# Patient Record
Sex: Male | Born: 1990 | Race: White | Hispanic: No | Marital: Single | Smoking: Current every day smoker
Health system: Southern US, Community
[De-identification: ages and names within clinical notes are randomized; demographics above are authoritative.]

## PROBLEM LIST (undated history)

## (undated) DIAGNOSIS — F32A Depression, unspecified: Secondary | ICD-10-CM

## (undated) DIAGNOSIS — K219 Gastro-esophageal reflux disease without esophagitis: Secondary | ICD-10-CM

## (undated) DIAGNOSIS — F429 Obsessive-compulsive disorder, unspecified: Secondary | ICD-10-CM

## (undated) DIAGNOSIS — G4700 Insomnia, unspecified: Secondary | ICD-10-CM

## (undated) DIAGNOSIS — G8929 Other chronic pain: Secondary | ICD-10-CM

## (undated) DIAGNOSIS — F329 Major depressive disorder, single episode, unspecified: Secondary | ICD-10-CM

## (undated) DIAGNOSIS — F419 Anxiety disorder, unspecified: Secondary | ICD-10-CM

## (undated) HISTORY — PX: SHOULDER ARTHROSCOPY W/ LABRAL REPAIR: SHX2399

---
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
## 2019-07-13 ENCOUNTER — Ambulatory Visit
Admission: EM | Admit: 2019-07-13 | Discharge: 2019-07-13 | Disposition: A | Discharge status: Home / Self Care | Payer: 59 | Attending: Family Medicine | Admitting: Family Medicine

## 2019-07-13 ENCOUNTER — Ambulatory Visit (INDEPENDENT_AMBULATORY_CARE_PROVIDER_SITE_OTHER): Payer: 59

## 2019-07-13 DIAGNOSIS — R0789 Other chest pain: Secondary | ICD-10-CM

## 2019-07-13 HISTORY — DX: Depression, unspecified: F32.A

## 2019-07-13 HISTORY — DX: Gastro-esophageal reflux disease without esophagitis: K21.9

## 2019-07-13 HISTORY — DX: Obsessive-compulsive disorder, unspecified: F42.9

## 2019-07-13 HISTORY — DX: Insomnia, unspecified: G47.00

## 2019-07-13 HISTORY — DX: Anxiety disorder, unspecified: F41.9

## 2019-07-13 HISTORY — DX: Other chronic pain: G89.29

## 2019-07-13 LAB — BASIC METABOLIC PANEL
Anion gap: 7 (ref 5–15)
BUN: 14 mg/dL (ref 6–20)
CO2: 26 mmol/L (ref 22–32)
Calcium: 9.2 mg/dL (ref 8.9–10.3)
Chloride: 104 mmol/L (ref 98–111)
Creatinine, Ser: 0.85 mg/dL (ref 0.61–1.24)
GFR calc Af Amer: >60 mL/min (ref >60)
GFR calc non Af Amer: >60 mL/min (ref >60)
Glucose, Bld: 111 mg/dL — ABNORMAL HIGH (ref 70–99)
Potassium: 3.9 mmol/L (ref 3.5–5.1)
Sodium: 137 mmol/L (ref 135–145)

## 2019-07-13 LAB — CBC WITH DIFFERENTIAL/PLATELET
Abs Immature Granulocytes: 0.05 10*3/uL (ref 0.00–0.07)
Basophils Absolute: 0.1 10*3/uL (ref 0.0–0.1)
Basophils Relative: 1 %
Eosinophils Absolute: 0.2 10*3/uL (ref 0.0–0.5)
Eosinophils Relative: 2 %
HCT: 42.4 % (ref 39.0–52.0)
Hemoglobin: 14.8 g/dL (ref 13.0–17.0)
Immature Granulocytes: 0 %
Lymphocytes Relative: 11 %
Lymphs Abs: 1.4 10*3/uL (ref 0.7–4.0)
MCH: 32 pg (ref 26.0–34.0)
MCHC: 34.9 g/dL (ref 30.0–36.0)
MCV: 91.8 fL (ref 80.0–100.0)
Monocytes Absolute: 0.7 10*3/uL (ref 0.1–1.0)
Monocytes Relative: 5 %
Neutro Abs: 10 10*3/uL — ABNORMAL HIGH (ref 1.7–7.7)
Neutrophils Relative %: 81 %
Platelets: 235 10*3/uL (ref 150–400)
RBC: 4.62 MIL/uL (ref 4.22–5.81)
RDW: 12.5 % (ref 11.5–15.5)
WBC: 12.4 10*3/uL — ABNORMAL HIGH (ref 4.0–10.5)
nRBC: 0 % (ref 0.0–0.2)

## 2019-07-13 NOTE — Discharge Instructions (Signed)
Follow up with Primary Care Provider for further evaluation/management and referral to cardiology

## 2019-07-13 NOTE — ED Provider Notes (Signed)
[facility]    CSN: [id number] Arrival date & time: 07/13/19  1056     History   Chief Complaint Chief Complaint  Patient presents with  . Chest Pain    HPI Darrell Meadows is a 28 y.o. male.   28 yo male with a c/o left sided chest pains yesterday. States he took some aspirin and felt better. States he felt the pain after he had been smoking marijuana yesterday and that he's had similar episodes in the past after smoking marijuana. Today was driving back home and felt a little bit dizzy and decided to come in for evaluation. States he took some aspirin about 30 min ago. Currently denies palpitations, shortness of breath, chest pressure, arm pain, jaw pain, neck pain. States he "feels like my heart may be enlarged" and would like to get checked for "LVH".   Also states with prior similar episodes in the past he's been evaluated and has had normal EKGs and Troponin.   Also states he takes medications for "mental health" issues.   The history is provided by the patient.    Past Medical History:  Diagnosis Date  . Anxiety   . Chronic pain   . Depression   . GERD (gastroesophageal reflux disease)   . Insomnia   . OCD (obsessive compulsive disorder)     There are no active problems to display for this patient.   Past Surgical History:  Procedure Laterality Date  . APPENDECTOMY  2003  . SHOULDER ARTHROSCOPY W/ LABRAL REPAIR         Home Medications    Prior to Admission medications   Medication Sig Start Date End Date Taking? Authorizing Provider  clomiPRAMINE (ANAFRANIL) 50 MG capsule Take 50 mg by mouth at bedtime.   Yes [provider]  dexlansoprazole (DEXILANT) 60 MG capsule Take 60 mg by mouth daily.   Yes [provider]  nebivolol (BYSTOLIC) 10 MG tablet Take 10 mg by mouth daily.   Yes [provider]  pregabalin (LYRICA) 150 MG capsule Take 150 mg by mouth 2 (two) times daily.   Yes [provider]   rosuvastatin (CRESTOR) 20 MG tablet Take 20 mg by mouth daily.   Yes [provider]  traZODone (DESYREL) 50 MG tablet Take 50 mg by mouth at bedtime.   Yes [provider]  vortioxetine HBr (TRINTELLIX) 20 MG TABS tablet Take 20 mg by mouth daily.   Yes [provider]    Family History Family History  Problem Relation Age of Onset  . Hypertension Mother   . Hyperlipidemia Mother   . Diabetes Father     Social History Social History   Tobacco Use  . Smoking status: Current Every Day Smoker    Types: Cigarettes  . Smokeless tobacco: Never Used  Substance Use Topics  . Alcohol use: Not Currently    Frequency: Never  . Drug use: Yes    Types: Marijuana     Allergies   Penicillins   Review of Systems Review of Systems   Physical Exam Triage Vital Signs ED Triage Vitals  Enc Vitals Group     BP 07/13/19 1102 127/72     Pulse Rate 07/13/19 1102 83     Resp 07/13/19 1102 18     Temp 07/13/19 1102 98.2 F (36.8 C)     Temp Source 07/13/19 1102 Oral     SpO2 07/13/19 1102 100 %     Weight 07/13/19 1104 220  lb (99.8 kg)     Height --      Head Circumference --      Peak Flow --      Pain Score 07/13/19 1104 4     Pain Loc --      Pain Edu? --      Excl. in GC? --    Orthostatic VS for the past 24 hrs:  BP- Lying Pulse- Lying BP- Sitting Pulse- Sitting BP- Standing at 0 minutes Pulse- Standing at 0 minutes  07/13/19 1228 130/70 75 134/71 80 117/74 90    Updated Vital Signs BP 118/76 (BP Location: Right Arm)   Pulse 83   Temp 98.2 F (36.8 C) (Oral)   Resp 18   Wt 99.8 kg   SpO2 100%   Visual Acuity Right Eye Distance:   Left Eye Distance:   Bilateral Distance:    Right Eye Near:   Left Eye Near:    Bilateral Near:     Physical Exam Vitals signs and nursing note reviewed.  Constitutional:      General: He is not in acute distress.    Appearance: He is not toxic-appearing or diaphoretic.  Cardiovascular:     Rate and  Rhythm: Normal rate and regular rhythm.     Pulses: Normal pulses.     Heart sounds: Normal heart sounds.  Pulmonary:     Effort: Pulmonary effort is normal. No respiratory distress.     Breath sounds: Normal breath sounds. No stridor. No wheezing, rhonchi or rales.  Musculoskeletal:     Right lower leg: No edema.     Left lower leg: No edema.  Neurological:     Mental Status: He is alert.      UC Treatments / Results  Labs (all labs ordered are listed, but only abnormal results are displayed) Labs Reviewed  CBC WITH DIFFERENTIAL/PLATELET - Abnormal; Notable for the following components:      Result Value   WBC 12.4 (*)    Neutro Abs 10.0 (*)    All other components within normal limits  BASIC METABOLIC PANEL - Abnormal; Notable for the following components:   Glucose, Bld 111 (*)    All other components within normal limits    EKG   Radiology Dg Chest 2 View  Result Date: 07/13/2019 CLINICAL DATA:  Left-sided chest pain palpitations.  Dizziness. EXAM: CHEST - 2 VIEW COMPARISON:  None. FINDINGS: The heart size and mediastinal contours are within normal limits. Both lungs are clear. No evidence of pneumothorax or pleural effusion. The visualized skeletal structures are unremarkable. IMPRESSION: Negative.  No active cardiopulmonary disease. Electronically Signed   By: Danae OrleansJohn A Stahl M.D.   On: 07/13/2019 11:58    Procedures ED EKG  Date/Time: 07/13/2019 5:25 PM Performed by: Payton Mccallumonty, Ernestyne Caldwell, MD Authorized by: Payton Mccallumonty, Bao Bazen, MD   ECG reviewed by ED Physician in the absence of a cardiologist: yes   Previous ECG:    Previous ECG:  Unavailable Interpretation:    Interpretation: normal   Rate:    ECG rate:  76   ECG rate assessment: normal   Rhythm:    Rhythm: sinus rhythm   Ectopy:    Ectopy: none   QRS:    QRS axis:  Normal   QRS intervals:  Normal Conduction:    Conduction: normal   ST segments:    ST segments:  Normal T waves:    T waves: normal      (including critical care time)  Medications Ordered in UC Medications - No data to display  Initial Impression / Assessment and Plan / UC Course  I have reviewed the triage vital signs and the nursing notes.  Pertinent labs & imaging results that were available during my care of the patient were reviewed by me and considered in my medical decision making (see chart for details).      Final Clinical Impressions(s) / UC Diagnoses   Final diagnoses:  Atypical chest pain     Discharge Instructions     Follow up with Primary Care Provider for further evaluation/management and referral to cardiology     ED Prescriptions    None      1. Labs/x-ray/ekg results and diagnosis reviewed with patient 2.Recommend supportive treatment as above; instructed to go to Emergency Department if symptoms recur 3. Follow-up prn   Controlled Substance Prescriptions [place] Controlled Substance Registry consulted? Not Applicable   Payton Mccallumonty, Majestic Brister, MD 07/13/19 1731

## 2019-07-13 NOTE — ED Triage Notes (Signed)
Pt was having left sided chest pain and did chew up some aspirin and pain did get much better. Has a hx of chest pain, palpitations with normal EKG and normal troponin. States he did feel like he was going to pass out before he took the aspirin. Feels as if "his heart is enlarged" right now he is having left sided chest discomfort and dizzy. Did take 325mg  aspirin 30 mins ago.

## 2020-07-17 IMAGING — CR CHEST - 2 VIEW
2 series · 2 of 2 positions shown · non-contrast
Comparison: None.

CLINICAL DATA: Left-sided chest pain palpitations.  Dizziness.

EXAM:
CHEST - 2 VIEW

[chest pa]
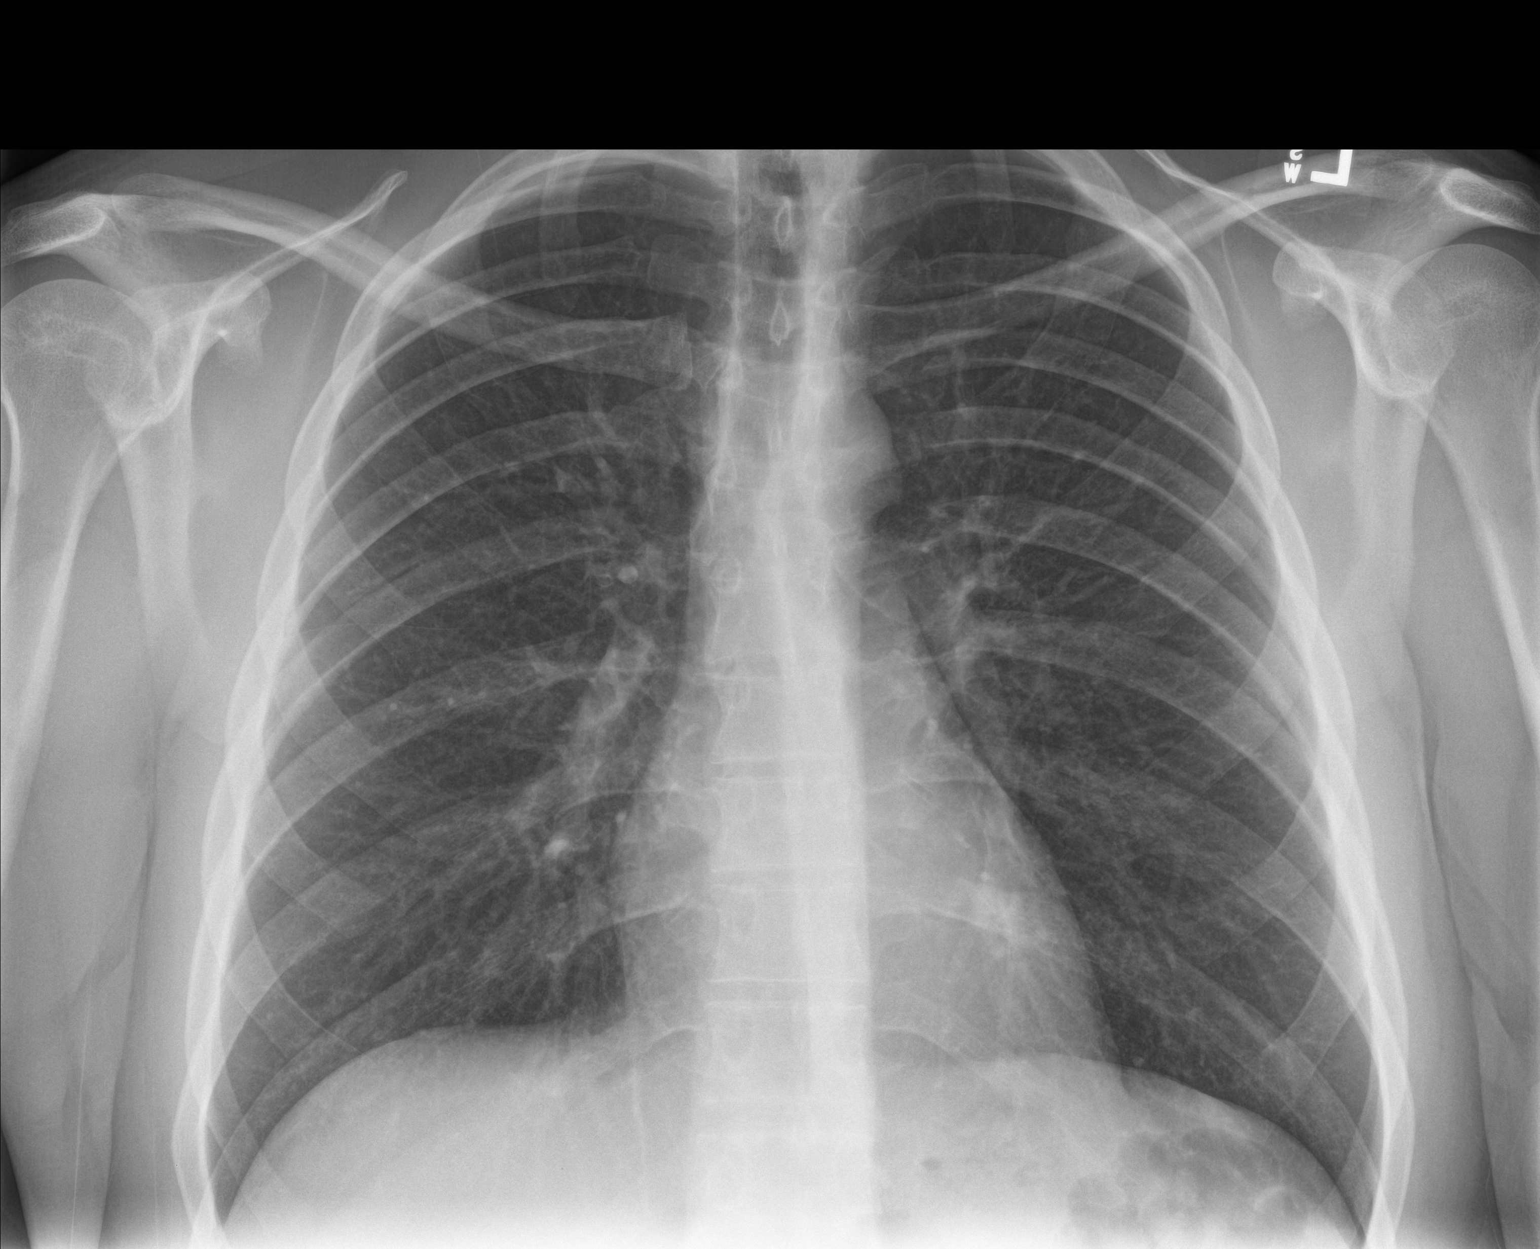

[chest lat]
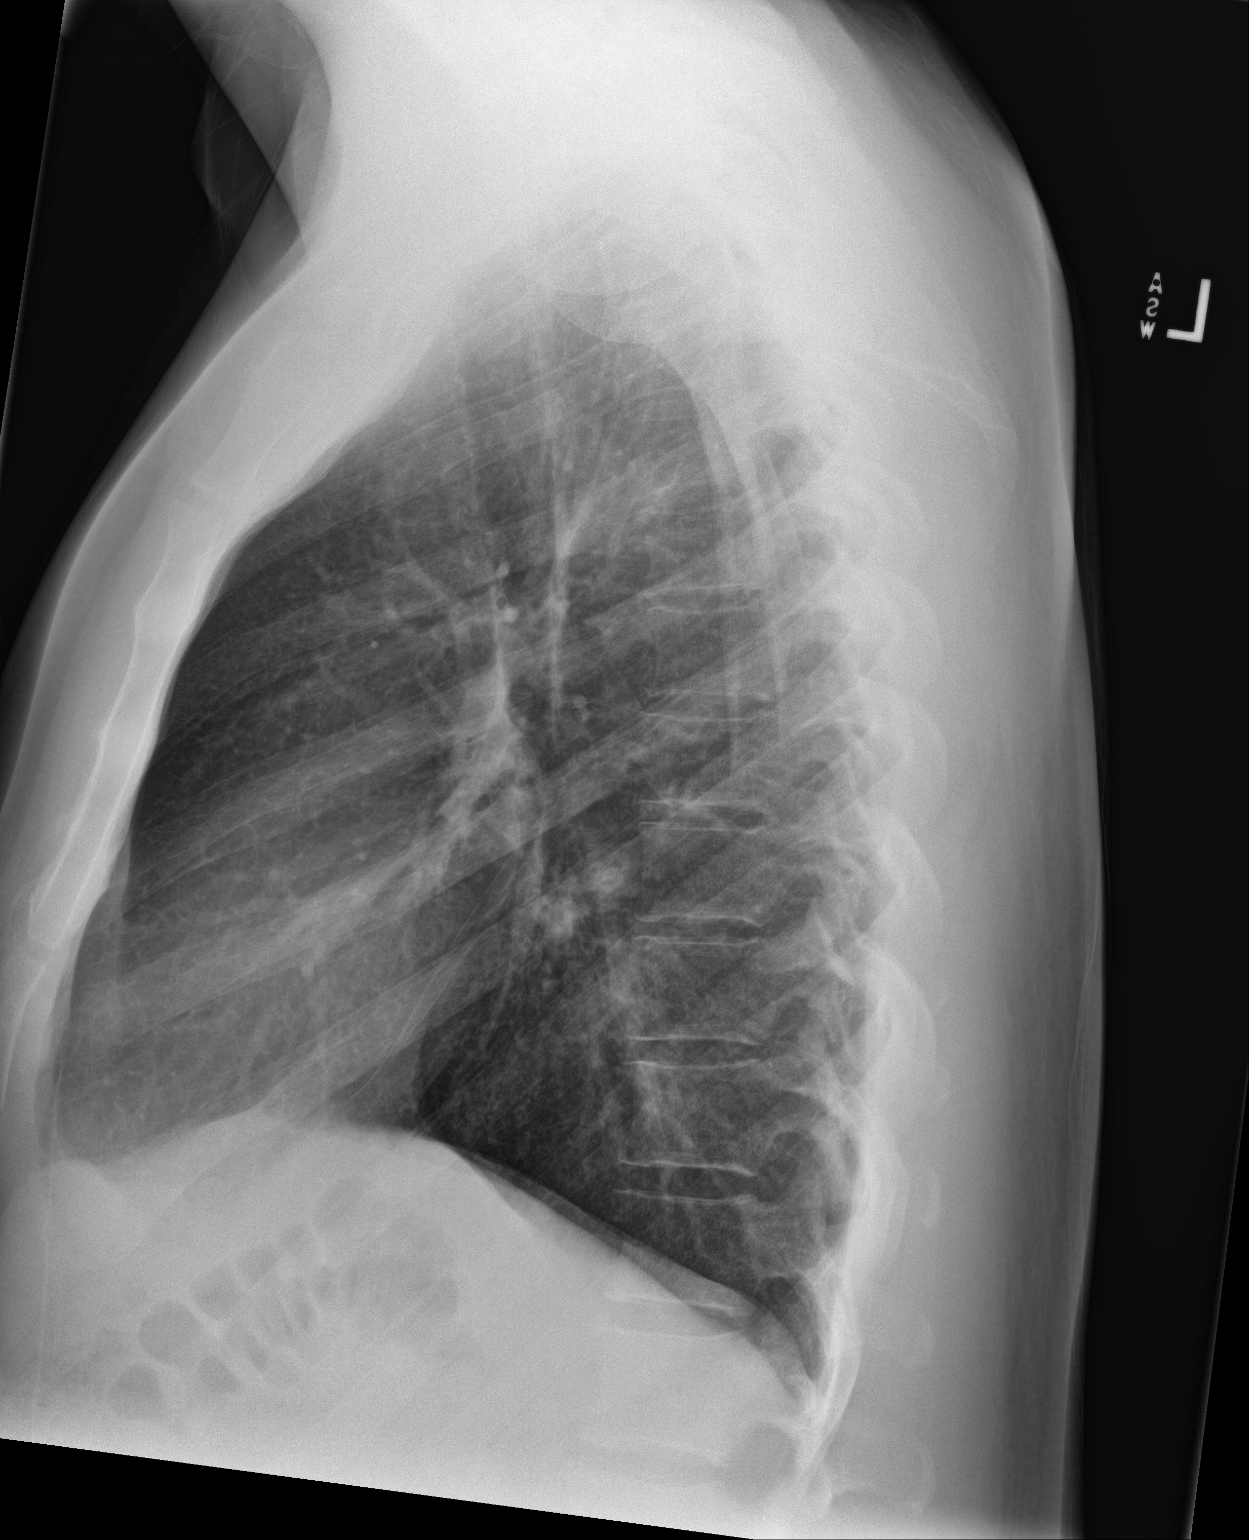

[2 of 2 positions shown; findings below may reference images not displayed]

FINDINGS: The heart size and mediastinal contours are within normal limits.
Both lungs are clear. No evidence of pneumothorax or pleural
effusion. The visualized skeletal structures are unremarkable.
IMPRESSION: Negative.  No active cardiopulmonary disease.
